# Patient Record
Sex: Female | Born: 2000 | Race: White | Hispanic: No | Marital: Single | Smoking: Former smoker
Health system: Southern US, Community
[De-identification: ages and names within clinical notes are randomized; demographics above are authoritative.]

## PROBLEM LIST (undated history)

## (undated) DIAGNOSIS — J45909 Unspecified asthma, uncomplicated: Secondary | ICD-10-CM

---
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
## 2018-02-26 ENCOUNTER — Encounter: Payer: Self-pay | Admitting: Emergency Medicine

## 2018-02-26 ENCOUNTER — Emergency Department
Admission: EM | Admit: 2018-02-26 | Discharge: 2018-02-26 | Disposition: A | Discharge status: Home / Self Care | Payer: Medicaid Other | Attending: Emergency Medicine | Admitting: Emergency Medicine

## 2018-02-26 DIAGNOSIS — H0100B Unspecified blepharitis left eye, upper and lower eyelids: Secondary | ICD-10-CM | POA: Insufficient documentation

## 2018-02-26 DIAGNOSIS — J45909 Unspecified asthma, uncomplicated: Secondary | ICD-10-CM | POA: Insufficient documentation

## 2018-02-26 DIAGNOSIS — B309 Viral conjunctivitis, unspecified: Secondary | ICD-10-CM | POA: Insufficient documentation

## 2018-02-26 DIAGNOSIS — F17210 Nicotine dependence, cigarettes, uncomplicated: Secondary | ICD-10-CM | POA: Insufficient documentation

## 2018-02-26 MED ORDER — SULFACETAMIDE SODIUM 10 % OP SOLN: 2.0000 [drp] | Freq: Four times a day (QID) | OPHTHALMIC | 0 refills | Status: AC

## 2018-02-26 MED ORDER — TETRACAINE HCL 0.5 % OP SOLN
2.0000 [drp] | Freq: Once | OPHTHALMIC | Status: AC
  Administered 2018-02-26: 2 [drp] via OPHTHALMIC
  Filled 2018-02-26: qty 4

## 2018-02-26 MED ORDER — FLUORESCEIN SODIUM 1 MG OP STRP
1.0000 | ORAL_STRIP | Freq: Once | OPHTHALMIC | Status: AC
  Administered 2018-02-26: 1 via OPHTHALMIC
  Filled 2018-02-26: qty 1

## 2018-02-26 MED ORDER — KETOROLAC TROMETHAMINE 0.5 % OP SOLN: 1.0000 [drp] | Freq: Four times a day (QID) | OPHTHALMIC | 0 refills | Status: DC

## 2018-02-26 NOTE — ED Triage Notes (Signed)
Pt comes into the ED via POV c/o left eye swelling that started today.  Patient states she thought there was something in there and she tried to flush her eye at work and it has now progressively gotten worse.  Patient in NAD at this time. Swelling and redness present to the eye.

## 2018-02-26 NOTE — ED Notes (Signed)
FIRST NURSE NOTE:  Pt c/o excess tearing and redness to left eye and states she feels likes there is something in her left eye.

## 2018-02-26 NOTE — ED Provider Notes (Signed)
[facility] Emergency Department Provider Note ____________________________________________  Time seen: 1559  I have reviewed the triage vital signs and the nursing notes.  HISTORY  Chief Complaint  Eye Problem  HPI Nicole Franco is a 17 y.o. female presents to the ED for evaluation of left eye redness, irritation and eyelid swelling.  Patient denies any trauma or injury to the eye.  She describes a sensation of a scratch like from an eyelash, to the eye.  She has been flushing the eye since she awoke with the discomfort.  She denies any improvement in her symptoms.  She also notes some excessive tearing but denies any purulent matting or crusting.  Patient is also without any nausea, vomiting, or vertigo, or headache.  She did endorse wearing corrective lenses including contacts.  She presents for further evaluation of left eye irritation.  Past Medical History:  Diagnosis Date  . Asthma     There are no active problems to display for this patient.  History reviewed. No pertinent surgical history.  Prior to Admission medications   Medication Sig Start Date End Date Taking? Authorizing Provider  benzonatate (TESSALON) 100 MG capsule Take 1 capsule (100 mg total) by mouth 3 (three) times daily as needed. 12/20/17   Tommie Sams, DO  ketorolac (ACULAR) 0.5 % ophthalmic solution Place 1 drop into the left eye 4 (four) times daily. 02/26/18   Curry Dulski, Charlesetta Ivory, PA-C  ondansetron (ZOFRAN-ODT) 4 MG disintegrating tablet Take 1 tablet (4 mg total) by mouth every 8 (eight) hours as needed for nausea or vomiting. 12/20/17   Tommie Sams, DO  predniSONE (DELTASONE) 50 MG tablet 1 tablet daily x 5 days 12/20/17   Tommie Sams, DO  sulfacetamide (BLEPH-10) 10 % ophthalmic solution Place 2 drops into the left eye 4 (four) times daily for 7 days. 02/26/18 03/05/18  Janda Cargo, Charlesetta Ivory, PA-C    Allergies Patient has no known allergies.  Family History  Problem  Relation Age of Onset  . Hypertension Mother     Social History Social History   Tobacco Use  . Smoking status: Current Every Day Smoker    Packs/day: 0.50    Types: Cigarettes  . Smokeless tobacco: Never Used  Substance Use Topics  . Alcohol use: No    Frequency: Never  . Drug use: No    Review of Systems  Constitutional: Negative for fever. Eyes: Negative for visual changes.  Left eye redness and tearing as above. ENT: Negative for sore throat. Cardiovascular: Negative for chest pain. Respiratory: Negative for shortness of breath. Gastrointestinal: Negative for abdominal pain, vomiting and diarrhea. Skin: Negative for rash. Neurological: Negative for headaches, focal weakness or numbness. ____________________________________________  PHYSICAL EXAM:  VITAL SIGNS: ED Triage Vitals [02/26/18 1530]  Enc Vitals Group     BP 123/69     Pulse Rate 97     Resp 18     Temp 97.9 F (36.6 C)     Temp Source Oral     SpO2 98 %     Weight 240 lb (108.9 kg)     Height  (1.702 m)     Head Circumference      Peak Flow      Pain Score 8     Pain Loc      Pain Edu?      Excl. in GC?     Constitutional: Alert and oriented. Well appearing and in no distress. Head: Normocephalic  and atraumatic. Eyes: Conjunctivae are injected on the left.  Upper and lower lids are edematous bilaterally.  No purulence or crusting are noted to the lashes.  There is no fluorescein dye uptake on Woods lamp exam.  Eversion of the lid does not elicit any gross foreign body.Marland Kitchen PERRL. Normal extraocular movements and fundus. Hematological/Lymphatic/Immunological: No preauricular lymphadenopathy. Cardiovascular: Normal rate, regular rhythm. Normal distal pulses. Respiratory: Normal respiratory effort. No wheezes/rales/rhonchi. Musculoskeletal: Nontender with normal range of motion in all extremities.  Neurologic:  Normal gait without ataxia. Normal speech and language. No gross focal neurologic  deficits are appreciated. Skin:  Skin is warm, dry and intact. No rash noted. ____________________________________________  PROCEDURES  Procedures   Visual Acuity  Right Eye Distance: 20/50 Left Eye Distance: 20/70 Bilateral Distance: (S) (pt states she is supposed to wear glasses but does not have them with her) ____________________________________________  INITIAL IMPRESSION / ASSESSMENT AND PLAN / ED COURSE  Patient with ED evaluation of sudden onset of left eye irritation upon awakening.  Patient is experienced left eye irritation and excessive tearing.  She also has some inflammation to the lids.  Her exam does not reveal a gross foreign body or corneal abrasion.  She likely has symptoms consistent with a viral conjunctivitis.  Patient will be treated empirically however, with sulfacetamide eyedrops as well as Acular for pain and inflammation.  She will follow-up with [facility] for routine management over return to the ED as needed. ____________________________________________  FINAL CLINICAL IMPRESSION(S) / ED DIAGNOSES  Final diagnoses:  Acute viral conjunctivitis of left eye  Blepharitis of both upper and lower eyelid of left eye, unspecified type      Lissa Hoard, PA-C 02/27/18 0006    Jene Every, MD 02/27/18 1435

## 2018-02-26 NOTE — Discharge Instructions (Signed)
You likely have a viral eye infection. You will, however be given an anti-inflammatory and antibiotic drop for the eye. Use the drops as directed. Follow-up with Dr. Inez Pilgrim as needed.

## 2018-07-19 IMAGING — CR DG ANKLE COMPLETE 3+V*R*
1 series · 3 of 3 positions shown · non-contrast
Comparison: 06/30/2011

CLINICAL DATA: Right ankle pain after falling down a step.

EXAM:
RIGHT ANKLE - COMPLETE 3+ VIEW

[Series 1: dg ankle complete right · 0.14mm/px · 3 of 3 slices shown]
[im 1/3]
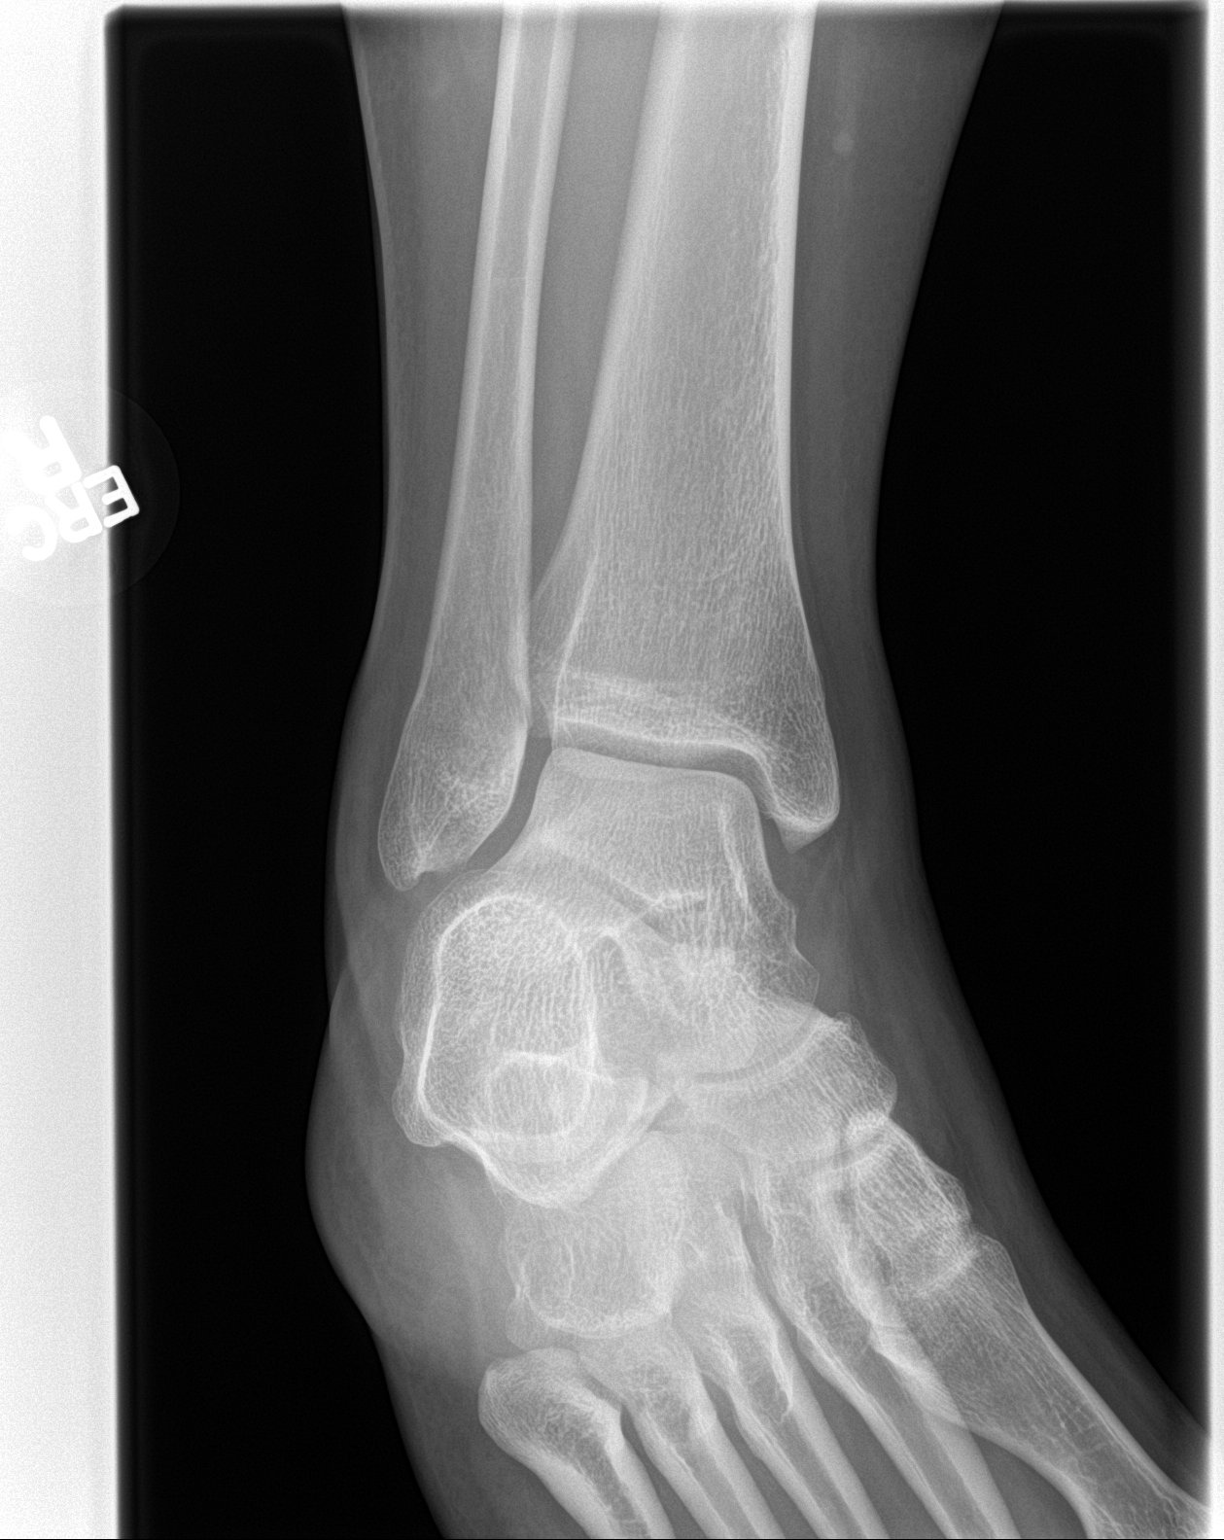
[im 2/3]
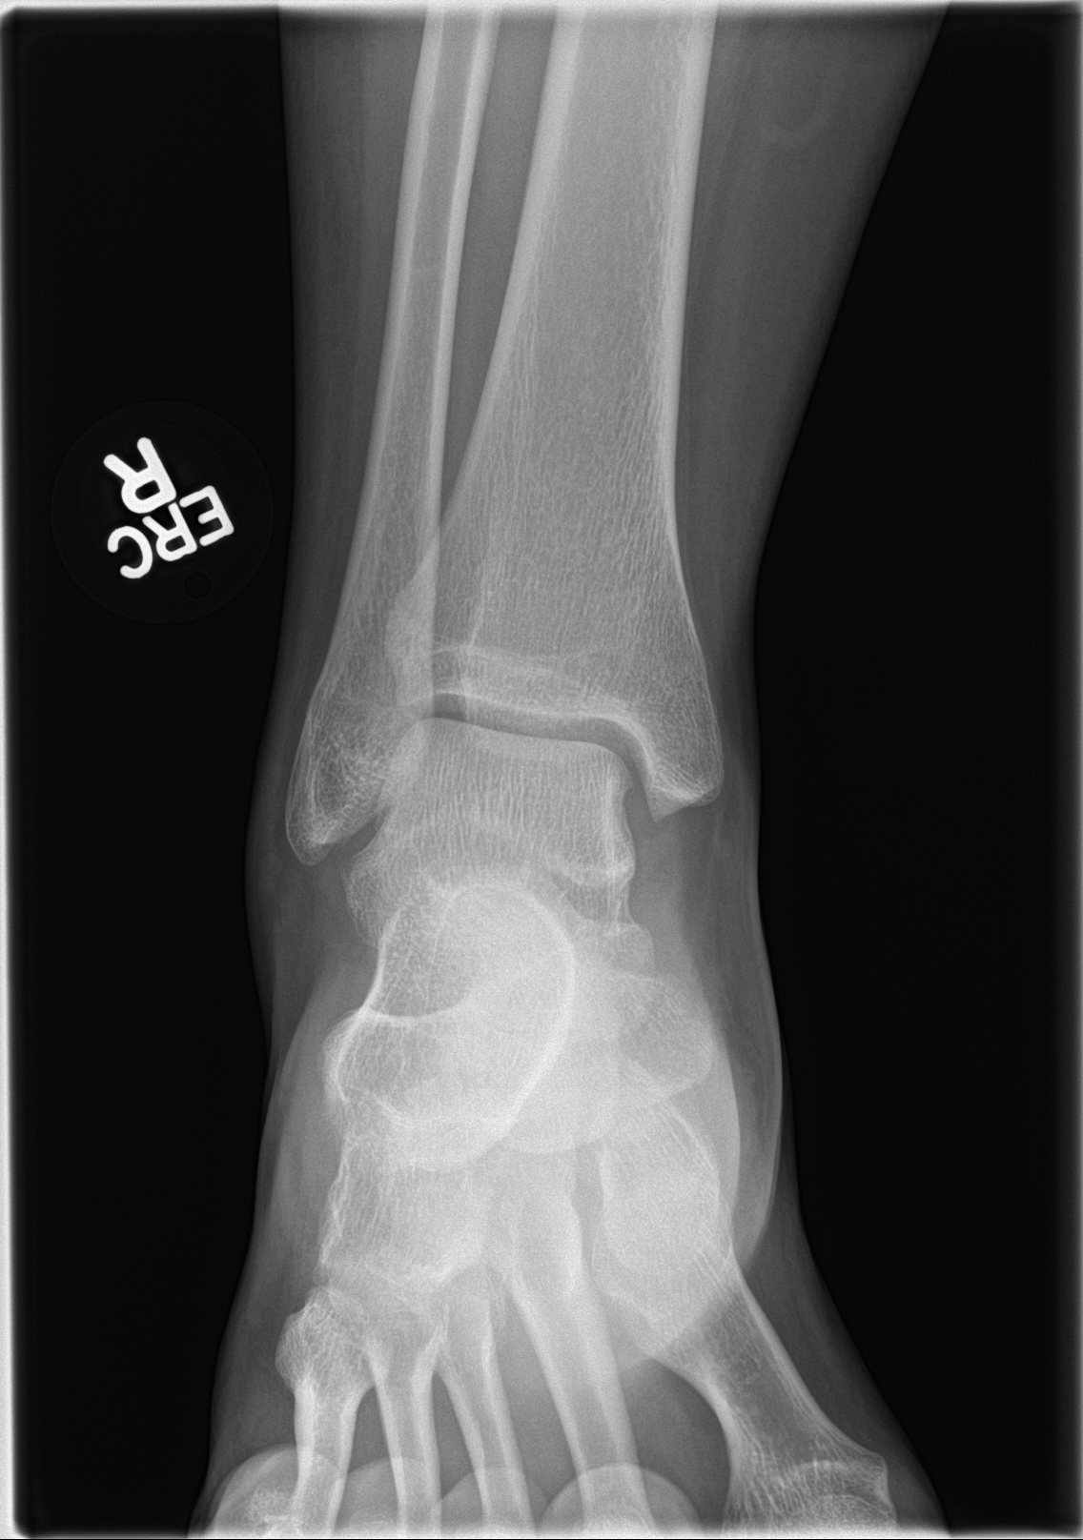
[im 3/3]
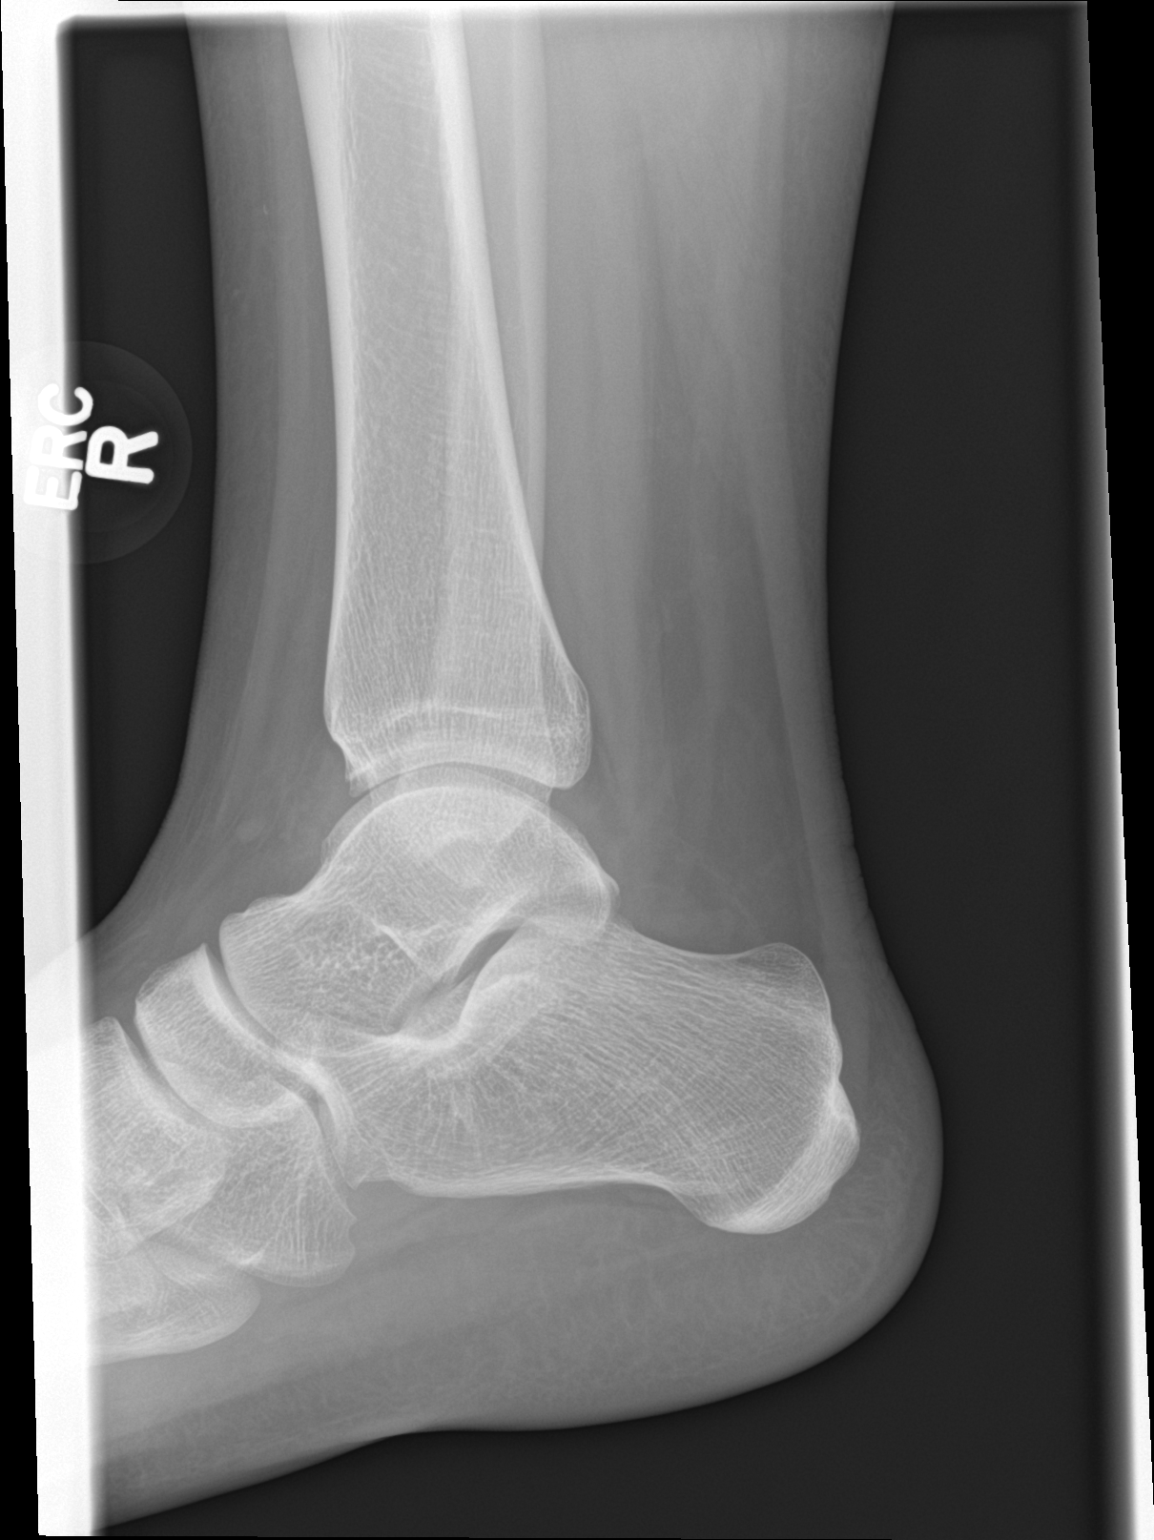

[3 of 3 positions shown; findings below may reference images not displayed]

FINDINGS: There is mild soft tissue swelling about the malleoli. The ankle
mortise is maintained. No fracture is seen. The base fifth
metatarsal appears intact. There is mild induration of Kager's fat
pad. There appears to be a trace ankle joint effusion.
IMPRESSION: Soft tissue swelling with trace joint effusion of the ankle. No
acute fracture or malalignment.

## 2020-03-18 ENCOUNTER — Other Ambulatory Visit: Payer: Self-pay

## 2020-03-18 ENCOUNTER — Ambulatory Visit (LOCAL_COMMUNITY_HEALTH_CENTER): Payer: Medicaid Other

## 2020-03-18 DIAGNOSIS — Z719 Counseling, unspecified: Secondary | ICD-10-CM

## 2020-03-18 NOTE — Progress Notes (Signed)
Pt. In Nurse Clinic requesting Tdap d/t household member having baby. RN counseled pt that she is not due for next Td/Tdap until 06/30/20 based on [facility] review. Copy of [facility] given to pt. Jerel Shepherd, RN

## 2020-12-15 ENCOUNTER — Other Ambulatory Visit: Payer: Self-pay

## 2020-12-15 ENCOUNTER — Ambulatory Visit
Admission: EM | Admit: 2020-12-15 | Discharge: 2020-12-15 | Disposition: A | Discharge status: Home / Self Care | Payer: Medicaid Other | Attending: Sports Medicine | Admitting: Sports Medicine

## 2020-12-15 ENCOUNTER — Encounter: Payer: Self-pay | Admitting: Emergency Medicine

## 2020-12-15 DIAGNOSIS — Z87891 Personal history of nicotine dependence: Secondary | ICD-10-CM | POA: Insufficient documentation

## 2020-12-15 DIAGNOSIS — R109 Unspecified abdominal pain: Secondary | ICD-10-CM | POA: Diagnosis not present

## 2020-12-15 DIAGNOSIS — B349 Viral infection, unspecified: Secondary | ICD-10-CM

## 2020-12-15 DIAGNOSIS — J029 Acute pharyngitis, unspecified: Secondary | ICD-10-CM

## 2020-12-15 DIAGNOSIS — R110 Nausea: Secondary | ICD-10-CM | POA: Diagnosis present

## 2020-12-15 DIAGNOSIS — Z20822 Contact with and (suspected) exposure to covid-19: Secondary | ICD-10-CM | POA: Diagnosis not present

## 2020-12-15 DIAGNOSIS — R1030 Lower abdominal pain, unspecified: Secondary | ICD-10-CM | POA: Diagnosis not present

## 2020-12-15 LAB — URINALYSIS, COMPLETE (UACMP) WITH MICROSCOPIC
Bilirubin Urine: NEGATIVE
Glucose, UA: NEGATIVE mg/dL
Hgb urine dipstick: NEGATIVE
Ketones, ur: NEGATIVE mg/dL
Leukocytes,Ua: NEGATIVE
Nitrite: NEGATIVE
Protein, ur: NEGATIVE mg/dL
Specific Gravity, Urine: >1.03 — ABNORMAL HIGH (ref 1.005–1.030)
pH: 6 (ref 5.0–8.0)

## 2020-12-15 LAB — SARS CORONAVIRUS 2 (TAT 6-24 HRS): SARS Coronavirus 2: NEGATIVE

## 2020-12-15 LAB — PREGNANCY, URINE: Preg Test, Ur: NEGATIVE

## 2020-12-15 LAB — GROUP A STREP BY PCR: Group A Strep by PCR: NOT DETECTED

## 2020-12-15 MED ORDER — LIDOCAINE VISCOUS HCL 2 % MT SOLN: 15.0000 mL | OROMUCOSAL | 0 refills | Status: AC | PRN

## 2020-12-15 MED ORDER — ONDANSETRON HCL 4 MG PO TABS: 4.0000 mg | ORAL_TABLET | Freq: Four times a day (QID) | ORAL | 0 refills | Status: AC | PRN

## 2020-12-15 NOTE — ED Provider Notes (Signed)
[facility]    CSN: [id number] Arrival date & time: 12/15/20  0816      History   Chief Complaint Chief Complaint  Patient presents with  . Sore Throat  . Nausea  . Abdominal Pain    HPI Nicole Franco is a 20 y.o. female presenting for onset of sore throat, nausea and lower abdominal cramping yesterday.  She denies any fever, fatigue, body aches, cough, congestion, chest pain or tightness, breathing difficulty, vomiting or diarrhea.  She says that the abdominal pain is moderate and intermittent.  She does states pretty generalized but worse of the lower abdomen.  She has taken over-the-counter nausea medication without relief.  Patient denies any sick contacts. Patient denies any known COVID-19 exposure not vaccinated for COVID-19.  Past medical history significant for asthma, but again denies any chest tightness or breathing problem.  Has not had to use her inhalers recently.  She denies any urinary complaints including dysuria, urinary frequency urgency.  Denies any vaginal discharge.  Last menstrual period was 11/23/2020.  No other complaints or concerns today.  HPI  Past Medical History:  Diagnosis Date  . Asthma     There are no problems to display for this patient.   History reviewed. No pertinent surgical history.  OB History   No obstetric history on file.      Home Medications    Prior to Admission medications   Medication Sig Start Date End Date Taking? Authorizing Provider  lidocaine (XYLOCAINE) 2 % solution Use as directed 15 mLs in the mouth or throat as needed for up to 5 days for mouth pain (swish and spit q3h). 12/15/20 12/20/20 Yes Eusebio Friendly B, PA-C  ondansetron (ZOFRAN) 4 MG tablet Take 1 tablet (4 mg total) by mouth every 6 (six) hours as needed for up to 5 days for nausea or vomiting. 12/15/20 12/20/20 Yes Shirlee Latch, PA-C  benzonatate (TESSALON) 100 MG capsule Take 1 capsule (100 mg total) by mouth 3 (three) times daily as needed.  12/20/17   Tommie Sams, DO  ketorolac (ACULAR) 0.5 % ophthalmic solution Place 1 drop into the left eye 4 (four) times daily. 02/26/18   Menshew, Charlesetta Ivory, PA-C  ondansetron (ZOFRAN-ODT) 4 MG disintegrating tablet Take 1 tablet (4 mg total) by mouth every 8 (eight) hours as needed for nausea or vomiting. 12/20/17   Tommie Sams, DO  predniSONE (DELTASONE) 50 MG tablet 1 tablet daily x 5 days 12/20/17   Tommie Sams, DO    Family History Family History  Problem Relation Age of Onset  . Hypertension Mother     Social History Social History   Tobacco Use  . Smoking status: Former Smoker    Packs/day: 0.50    Types: Cigarettes  . Smokeless tobacco: Never Used  Vaping Use  . Vaping Use: Every day  Substance Use Topics  . Alcohol use: No  . Drug use: No     Allergies   Patient has no known allergies.   Review of Systems Review of Systems  Constitutional: Negative for chills, diaphoresis, fatigue and fever.  HENT: Positive for sore throat. Negative for congestion, ear pain, rhinorrhea, sinus pressure and sinus pain.   Respiratory: Negative for cough, shortness of breath and wheezing.   Cardiovascular: Negative for chest pain.  Gastrointestinal: Positive for abdominal pain and nausea. Negative for vomiting.  Genitourinary: Negative for dysuria, frequency and vaginal discharge.  Musculoskeletal: Negative for arthralgias and myalgias.  Skin:  Negative for rash.  Neurological: Negative for weakness and headaches.  Hematological: Negative for adenopathy.     Physical Exam Triage Vital Signs ED Triage Vitals  Enc Vitals Group     BP 12/15/20 0841 124/86     Pulse Rate 12/15/20 0841 75     Resp 12/15/20 0841 18     Temp 12/15/20 0841 97.8 F (36.6 C)     Temp Source 12/15/20 0841 Oral     SpO2 12/15/20 0841 100 %     Weight 12/15/20 0839 240 lb 1.3 oz (108.9 kg)     Height 12/15/20 0839  (1.702 m)     Head Circumference --      Peak Flow --      Pain Score  12/15/20 0837 2     Pain Loc --      Pain Edu? --      Excl. in [place]? --    No data found.  Updated Vital Signs BP 124/86 (BP Location: Left Arm)   Pulse 75   Temp 97.8 F (36.6 C) (Oral)   Resp 18   Ht  (1.702 m)   Wt 240 lb 1.3 oz (108.9 kg)   LMP 11/23/2020 (Approximate)   SpO2 100%   BMI 37.60 kg/m       Physical Exam Vitals and nursing note reviewed.  Constitutional:      General: She is not in acute distress.    Appearance: Normal appearance. She is not ill-appearing or toxic-appearing.  HENT:     Head: Normocephalic and atraumatic.     Nose: Nose normal.     Mouth/Throat:     Mouth: Mucous membranes are moist.     Pharynx: Oropharynx is clear. Posterior oropharyngeal erythema present.     Tonsils: 1+ on the right. 1+ on the left.  Eyes:     General: No scleral icterus.       Right eye: No discharge.        Left eye: No discharge.     Conjunctiva/sclera: Conjunctivae normal.  Cardiovascular:     Rate and Rhythm: Normal rate and regular rhythm.     Heart sounds: Normal heart sounds.  Pulmonary:     Effort: Pulmonary effort is normal. No respiratory distress.     Breath sounds: Normal breath sounds.  Abdominal:     General: Abdomen is protuberant.     Palpations: Abdomen is soft.     Tenderness: There is generalized abdominal tenderness.  Musculoskeletal:     Cervical back: Neck supple.  Skin:    General: Skin is dry.  Neurological:     General: No focal deficit present.     Mental Status: She is alert. Mental status is at baseline.     Motor: No weakness.     Gait: Gait normal.  Psychiatric:        Mood and Affect: Mood normal.        Behavior: Behavior normal.        Thought Content: Thought content normal.      UC Treatments / Results  Labs (all labs ordered are listed, but only abnormal results are displayed) Labs Reviewed  GROUP A STREP BY PCR  SARS CORONAVIRUS 2 (TAT 6-24 HRS)  URINALYSIS, COMPLETE (UACMP) WITH MICROSCOPIC   PREGNANCY, URINE    EKG   Radiology No results found.  Procedures Procedures (including critical care time)  Medications Ordered in UC Medications - No data to display  Initial Impression /  Assessment and Plan / UC Course  I have reviewed the triage vital signs and the nursing notes.  Pertinent labs & imaging results that were available during my care of the patient were reviewed by me and considered in my medical decision making (see chart for details).   20 year old female presenting for sore throat, nausea and abdominal cramping that is generalized and intermittent.  All vital signs are normal and stable in the clinic.  Patient is overall well-appearing and does have her 2 children in the exam room.  Exam significant for mild posterior pharyngeal erythema and generalized abdominal tenderness that appears to be mild to moderate.  No guarding or rebound.  Chest is clear to auscultation heart regular rate and rhythm.  Molecular strep test is negative.  Urinalysis and urine pregnancy obtained today.  Negative pregnancy and UA just shows greater than 1.030 specific gravity.  No evidence of infection.  Patient advised to increase fluids.  Send out COVID-19 test performed.  Current CDC guidelines, isolation protocol and ED precautions reviewed with patient.  Advised her that it is most likely she has a viral illness and could be possibly COVID-19.  Encouraged her to increase rest and fluids.  I did send Zofran for the nausea and Viscous Lidocaine for her throat pain.  Advised for her to follow-up with our department or PCP if not improving over the next several days.  ED precautions for abdominal pain reviewed patient, but vitals and exam are reassuring and patient is stable.  No acute abdomen suspected.  Final Clinical Impressions(s) / UC Diagnoses   Final diagnoses:  Viral illness  Sore throat  Nausea  Abdominal cramping     Discharge Instructions     URI/COLD SYMPTOMS: Your  exam today is consistent with a viral illness. Antibiotics are not indicated at this time. Use medications as directed, including cough syrup, nasal saline, and decongestants. Your symptoms should improve over the next few days and resolve within 7-10 days. Increase rest and fluids. F/u if symptoms worsen or predominate such as sore throat, ear pain, productive cough, shortness of breath, or if you develop high fevers or worsening fatigue over the next several days.    ABDOMINAL PAIN: You may take Tylenol for pain relief. Use medications as directed including antiemetics and antidiarrheal medications if suggested or prescribed. You should increase fluids and electrolytes as well as rest over these next several days. If you have any questions or concerns, or if your symptoms are not improving or if especially if they acutely worsen, please call or stop back to the clinic immediately and we will be happy to help you or go to the ER   ABDOMINAL PAIN RED FLAGS: Seek immediate further care if: symptoms remain the same or worsen over the next 3-7 days, you are unable to keep fluids down, you see blood or mucus in your stool, you vomit black or dark red material, you have a fever of 101.F or higher, you have localized and/or persistent abdominal pain    You have received COVID testing today either for positive exposure, concerning symptoms that could be related to COVID infection, screening purposes, or re-testing after confirmed positive.  Your test obtained today checks for active viral infection in the last 1-2 weeks. If your test is negative now, you can still test positive later. So, if you do develop symptoms you should either get re-tested and/or isolate x 5 days and then strict mask use x 5 days (unvaccinated) or mask use  x 10 days (vaccinated). Please follow CDC guidelines.  While Rapid antigen tests come back in 15-20 minutes, send out PCR/molecular test results typically come back within 1-3 days. In  the mean time, if you are symptomatic, assume this could be a positive test and treat/monitor yourself as if you do have COVID.   We will call with test results if positive. Please download the MyChart app and set up a profile to access test results.   If symptomatic, go home and rest. Push fluids. Take Tylenol as needed for discomfort. Gargle warm salt water. Throat lozenges. Take Mucinex DM or Robitussin for cough. Humidifier in bedroom to ease coughing. Warm showers. Also review the COVID handout for more information.  COVID-19 INFECTION: The incubation period of COVID-19 is approximately 14 days after exposure, with most symptoms developing in roughly 4-5 days. Symptoms may range in severity from mild to critically severe. Roughly 80% of those infected will have mild symptoms. People of any age may become infected with COVID-19 and have the ability to transmit the virus. The most common symptoms include: fever, fatigue, cough, body aches, headaches, sore throat, nasal congestion, shortness of breath, nausea, vomiting, diarrhea, changes in smell and/or taste.    COURSE OF ILLNESS Some patients may begin with mild disease which can progress quickly into critical symptoms. If your symptoms are worsening please call ahead to the Emergency Department and proceed there for further treatment. Recovery time appears to be roughly 1-2 weeks for mild symptoms and 3-6 weeks for severe disease.   GO IMMEDIATELY TO ER FOR FEVER YOU ARE UNABLE TO GET DOWN WITH TYLENOL, BREATHING PROBLEMS, CHEST PAIN, FATIGUE, LETHARGY, INABILITY TO EAT OR DRINK, ETC  QUARANTINE AND ISOLATION: To help decrease the spread of COVID-19 please remain isolated if you have COVID infection or are highly suspected to have COVID infection. This means -stay home and isolate to one room in the home if you live with others. Do not share a bed or bathroom with others while ill, sanitize and wipe down all countertops and keep common areas clean  and disinfected. Stay home for 5 days. If you have no symptoms or your symptoms are resolving after 5 days, you can leave your house. Continue to wear a mask around others for 5 additional days. If you have been in close contact (within 6 feet) of someone diagnosed with COVID 19, you are advised to quarantine in your home for 14 days as symptoms can develop anywhere from 2-14 days after exposure to the virus. If you develop symptoms, you  must isolate.  Most current guidelines for COVID after exposure -unvaccinated: isolate 5 days and strict mask use x 5 days. Test on day 5 is possible -vaccinated: wear mask x 10 days if symptoms do not develop -You do not necessarily need to be tested for COVID if you have + exposure and  develop symptoms. Just isolate at home x10 days from symptom onset During this global pandemic, CDC advises to practice social distancing, try to stay at least 36ft away from others at all times. Wear a face covering. Wash and sanitize your hands regularly and avoid going anywhere that is not necessary.  KEEP IN MIND THAT THE COVID TEST IS NOT 100% ACCURATE AND YOU SHOULD STILL DO EVERYTHING TO PREVENT POTENTIAL SPREAD OF VIRUS TO OTHERS (WEAR MASK, WEAR GLOVES, WASH HANDS AND SANITIZE REGULARLY). IF INITIAL TEST IS NEGATIVE, THIS MAY NOT MEAN YOU ARE DEFINITELY NEGATIVE. MOST ACCURATE TESTING IS DONE 5-7 DAYS  AFTER EXPOSURE.   It is not advised by CDC to get re-tested after receiving a positive COVID test since you can still test positive for weeks to months after you have already cleared the virus.   *If you have not been vaccinated for COVID, I strongly suggest you consider getting vaccinated as long as there are no contraindications.      ED Prescriptions    Medication Sig Dispense Auth. Provider   ondansetron (ZOFRAN) 4 MG tablet Take 1 tablet (4 mg total) by mouth every 6 (six) hours as needed for up to 5 days for nausea or vomiting. 20 tablet Eusebio FriendlyEaves, Koa Palla B, PA-C    lidocaine (XYLOCAINE) 2 % solution Use as directed 15 mLs in the mouth or throat as needed for up to 5 days for mouth pain (swish and spit q3h). 100 mL Shirlee LatchEaves, Jules Baty B, PA-C     PDMP not reviewed this encounter.   Shirlee Latchaves, Joell Usman B, PA-C 12/15/20 1018

## 2020-12-15 NOTE — ED Triage Notes (Signed)
Pt c/o sore throat, nausea, and lower abdominal pain. Started yesterday. Denies fever or other URI symptoms.

## 2020-12-15 NOTE — Discharge Instructions (Signed)
URI/COLD SYMPTOMS: Your exam today is consistent with a viral illness. Antibiotics are not indicated at this time. Use medications as directed, including cough syrup, nasal saline, and decongestants. Your symptoms should improve over the next few days and resolve within 7-10 days. Increase rest and fluids. F/u if symptoms worsen or predominate such as sore throat, ear pain, productive cough, shortness of breath, or if you develop high fevers or worsening fatigue over the next several days.    ABDOMINAL PAIN: You may take Tylenol for pain relief. Use medications as directed including antiemetics and antidiarrheal medications if suggested or prescribed. You should increase fluids and electrolytes as well as rest over these next several days. If you have any questions or concerns, or if your symptoms are not improving or if especially if they acutely worsen, please call or stop back to the clinic immediately and we will be happy to help you or go to the ER   ABDOMINAL PAIN RED FLAGS: Seek immediate further care if: symptoms remain the same or worsen over the next 3-7 days, you are unable to keep fluids down, you see blood or mucus in your stool, you vomit black or dark red material, you have a fever of 101.F or higher, you have localized and/or persistent abdominal pain    You have received COVID testing today either for positive exposure, concerning symptoms that could be related to COVID infection, screening purposes, or re-testing after confirmed positive.  Your test obtained today checks for active viral infection in the last 1-2 weeks. If your test is negative now, you can still test positive later. So, if you do develop symptoms you should either get re-tested and/or isolate x 5 days and then strict mask use x 5 days (unvaccinated) or mask use x 10 days (vaccinated). Please follow CDC guidelines.  While Rapid antigen tests come back in 15-20 minutes, send out PCR/molecular test results typically come  back within 1-3 days. In the mean time, if you are symptomatic, assume this could be a positive test and treat/monitor yourself as if you do have COVID.   We will call with test results if positive. Please download the MyChart app and set up a profile to access test results.   If symptomatic, go home and rest. Push fluids. Take Tylenol as needed for discomfort. Gargle warm salt water. Throat lozenges. Take Mucinex DM or Robitussin for cough. Humidifier in bedroom to ease coughing. Warm showers. Also review the COVID handout for more information.  COVID-19 INFECTION: The incubation period of COVID-19 is approximately 14 days after exposure, with most symptoms developing in roughly 4-5 days. Symptoms may range in severity from mild to critically severe. Roughly 80% of those infected will have mild symptoms. People of any age may become infected with COVID-19 and have the ability to transmit the virus. The most common symptoms include: fever, fatigue, cough, body aches, headaches, sore throat, nasal congestion, shortness of breath, nausea, vomiting, diarrhea, changes in smell and/or taste.    COURSE OF ILLNESS Some patients may begin with mild disease which can progress quickly into critical symptoms. If your symptoms are worsening please call ahead to the Emergency Department and proceed there for further treatment. Recovery time appears to be roughly 1-2 weeks for mild symptoms and 3-6 weeks for severe disease.   GO IMMEDIATELY TO ER FOR FEVER YOU ARE UNABLE TO GET DOWN WITH TYLENOL, BREATHING PROBLEMS, CHEST PAIN, FATIGUE, LETHARGY, INABILITY TO EAT OR DRINK, ETC  QUARANTINE AND ISOLATION: To help  decrease the spread of COVID-19 please remain isolated if you have COVID infection or are highly suspected to have COVID infection. This means -stay home and isolate to one room in the home if you live with others. Do not share a bed or bathroom with others while ill, sanitize and wipe down all countertops and  keep common areas clean and disinfected. Stay home for 5 days. If you have no symptoms or your symptoms are resolving after 5 days, you can leave your house. Continue to wear a mask around others for 5 additional days. If you have been in close contact (within 6 feet) of someone diagnosed with COVID 19, you are advised to quarantine in your home for 14 days as symptoms can develop anywhere from 2-14 days after exposure to the virus. If you develop symptoms, you  must isolate.  Most current guidelines for COVID after exposure -unvaccinated: isolate 5 days and strict mask use x 5 days. Test on day 5 is possible -vaccinated: wear mask x 10 days if symptoms do not develop -You do not necessarily need to be tested for COVID if you have + exposure and  develop symptoms. Just isolate at home x10 days from symptom onset During this global pandemic, CDC advises to practice social distancing, try to stay at least 61ft away from others at all times. Wear a face covering. Wash and sanitize your hands regularly and avoid going anywhere that is not necessary.  KEEP IN MIND THAT THE COVID TEST IS NOT 100% ACCURATE AND YOU SHOULD STILL DO EVERYTHING TO PREVENT POTENTIAL SPREAD OF VIRUS TO OTHERS (WEAR MASK, WEAR GLOVES, WASH HANDS AND SANITIZE REGULARLY). IF INITIAL TEST IS NEGATIVE, THIS MAY NOT MEAN YOU ARE DEFINITELY NEGATIVE. MOST ACCURATE TESTING IS DONE 5-7 DAYS AFTER EXPOSURE.   It is not advised by CDC to get re-tested after receiving a positive COVID test since you can still test positive for weeks to months after you have already cleared the virus.   *If you have not been vaccinated for COVID, I strongly suggest you consider getting vaccinated as long as there are no contraindications.

## 2022-09-12 ENCOUNTER — Ambulatory Visit
Admission: EM | Admit: 2022-09-12 | Discharge: 2022-09-12 | Disposition: A | Discharge status: Home / Self Care | Payer: Medicaid Other | Attending: Family Medicine | Admitting: Family Medicine

## 2022-09-12 ENCOUNTER — Encounter: Payer: Self-pay | Admitting: Emergency Medicine

## 2022-09-12 DIAGNOSIS — M79671 Pain in right foot: Secondary | ICD-10-CM | POA: Diagnosis not present

## 2022-09-12 MED ORDER — MELOXICAM 15 MG PO TABS: 15.0000 mg | ORAL_TABLET | Freq: Every day | ORAL | 0 refills | Status: AC | PRN

## 2022-09-12 NOTE — ED Triage Notes (Signed)
Patient c/o pain on the top of her right foot for the past 3-4 weeks.  Patient denies injury or fall.

## 2022-09-12 NOTE — ED Provider Notes (Signed)
[facility]    CSN: [id number] Arrival date & time: 09/12/22  1427      History   Chief Complaint Chief Complaint  Patient presents with   Foot Pain    right    HPI 21 year old female presents with foot pain.  Patient reports right foot pain for the past 3 weeks.  Located on the distal, dorsum of the right foot at the level of the MTP joints.  She states that she feels like she has had some swelling.  Denies fall, trauma, injury.  No relieving factors.  Pain currently 4/10 in severity.  She is on her feet quite a lot.  No other complaints or concerns at this time.  Home Medications    Prior to Admission medications   Medication Sig Start Date End Date Taking? Authorizing Provider  meloxicam (MOBIC) 15 MG tablet Take 1 tablet (15 mg total) by mouth daily as needed for pain. 09/12/22  Yes Coral Spikes, DO    Family History Family History  Problem Relation Age of Onset   Hypertension Mother     Social History Social History   Tobacco Use   Smoking status: Former    Packs/day: 0.50    Types: Cigarettes   Smokeless tobacco: Never  Vaping Use   Vaping Use: Every day  Substance Use Topics   Alcohol use: No   Drug use: No     Allergies   Patient has no known allergies.   Review of Systems Review of Systems Per HPI  Physical Exam Triage Vital Signs ED Triage Vitals  Enc Vitals Group     BP 09/12/22 1436 105/73     Pulse Rate 09/12/22 1436 76     Resp 09/12/22 1436 14     Temp 09/12/22 1436 98.7 F (37.1 C)     Temp Source 09/12/22 1436 Oral     SpO2 09/12/22 1436 97 %     Weight 09/12/22 1434 240 lb (108.9 kg)     Height 09/12/22 1434 5\' 7"  (1.702 m)     Head Circumference --      Peak Flow --      Pain Score 09/12/22 1434 4     Pain Loc --      Pain Edu? --      Excl. in [place]? --    Updated Vital Signs BP 105/73 (BP Location: Right Arm)   Pulse 76   Temp 98.7 F (37.1 C) (Oral)   Resp 14   Ht 5\' 7"  (1.702 m)   Wt 108.9 kg    LMP 08/24/2022 (Approximate)   SpO2 97%   BMI 37.59 kg/m   Visual Acuity Right Eye Distance:   Left Eye Distance:   Bilateral Distance:    Right Eye Near:   Left Eye Near:    Bilateral Near:     Physical Exam Vitals and nursing note reviewed.  Constitutional:      Appearance: Normal appearance. She is obese.  HENT:     Head: Normocephalic and atraumatic.  Pulmonary:     Effort: Pulmonary effort is normal. No respiratory distress.  Musculoskeletal:     Comments: Mild tenderness of the distal right foot just below the second and third MTP joints.  Neurological:     Mental Status: She is alert.  Psychiatric:        Mood and Affect: Mood normal.        Behavior: Behavior normal.      UC Treatments /  Results  Labs (all labs ordered are listed, but only abnormal results are displayed) Labs Reviewed - No data to display  EKG   Radiology No results found.  Procedures Procedures (including critical care time)  Medications Ordered in UC Medications - No data to display  Initial Impression / Assessment and Plan / UC Course  I have reviewed the triage vital signs and the nursing notes.  Pertinent labs & imaging results that were available during my care of the patient were reviewed by me and considered in my medical decision making (see chart for details).    21 year old female presents with right foot pain.  No evidence of swelling, erythema, or bruising.  No indications for imaging.  Meloxicam as directed.  Advised that metatarsal pad may help.  Supportive care.  Final Clinical Impressions(s) / UC Diagnoses   Final diagnoses:  Right foot pain     Discharge Instructions      Try a metatarsal pad. You need additional support.  Medication as needed.  If continues to persist, recommend seeing [facility] clinic podiatry.  Take care  Dr. Adriana Simas    ED Prescriptions     Medication Sig Dispense Auth. Provider   meloxicam (MOBIC) 15 MG tablet Take 1 tablet  (15 mg total) by mouth daily as needed for pain. 30 tablet Tommie Sams, DO      PDMP not reviewed this encounter.   Tommie Sams, [place] 09/12/22 1539

## 2022-09-12 NOTE — Discharge Instructions (Signed)
Try a metatarsal pad. You need additional support.  Medication as needed.  If continues to persist, recommend seeing [facility] clinic podiatry.  Take care  Dr. Adriana Simas
